# Patient Record
Sex: Male | Born: 1960 | Race: White | Hispanic: No | Marital: Single | Smoking: Current every day smoker
Health system: Southern US, Community
[De-identification: ages and names within clinical notes are randomized; demographics above are authoritative.]

## PROBLEM LIST (undated history)

## (undated) DIAGNOSIS — I059 Rheumatic mitral valve disease, unspecified: Secondary | ICD-10-CM

---

## 2005-03-29 ENCOUNTER — Emergency Department: Payer: Self-pay | Admitting: Emergency Medicine

## 2005-03-29 ENCOUNTER — Other Ambulatory Visit: Payer: Self-pay

## 2006-07-21 ENCOUNTER — Emergency Department: Payer: Self-pay | Admitting: Emergency Medicine

## 2007-11-08 ENCOUNTER — Emergency Department: Payer: Self-pay | Admitting: Emergency Medicine

## 2009-06-12 ENCOUNTER — Emergency Department: Payer: Self-pay | Admitting: Emergency Medicine

## 2009-06-25 ENCOUNTER — Emergency Department: Payer: Self-pay | Admitting: Emergency Medicine

## 2011-07-14 ENCOUNTER — Emergency Department: Payer: Self-pay | Admitting: Emergency Medicine

## 2011-07-14 LAB — CBC
HCT: 50.5 % (ref 40.0–52.0)
HGB: 17.3 g/dL (ref 13.0–18.0)
MCH: 34.1 pg — ABNORMAL HIGH (ref 26.0–34.0)
MCV: 100 fL (ref 80–100)
Platelet: 187 10*3/uL (ref 150–440)
RDW: 13.5 % (ref 11.5–14.5)
WBC: 9.2 10*3/uL (ref 3.8–10.6)

## 2011-07-14 LAB — BASIC METABOLIC PANEL
Anion Gap: 13 (ref 7–16)
Calcium, Total: 8.8 mg/dL (ref 8.5–10.1)
Co2: 25 mmol/L (ref 21–32)
EGFR (African American): >60
EGFR (Non-African Amer.): >60
Glucose: 97 mg/dL (ref 65–99)
Osmolality: 284 (ref 275–301)
Potassium: 4 mmol/L (ref 3.5–5.1)
Sodium: 144 mmol/L (ref 136–145)

## 2011-07-14 LAB — URINALYSIS, COMPLETE
Glucose,UR: 50 mg/dL (ref 0–75)
Hyaline Cast: 4
Leukocyte Esterase: NEGATIVE
Nitrite: NEGATIVE
Protein: 30
Specific Gravity: 1.024 (ref 1.003–1.030)
WBC UR: 6 /HPF (ref 0–5)

## 2011-07-14 LAB — ETHANOL
Ethanol %: 0.052 % (ref 0.000–0.080)
Ethanol: 52 mg/dL

## 2011-07-14 LAB — TROPONIN I: Troponin-I: <0.02 ng/mL

## 2011-11-14 ENCOUNTER — Emergency Department: Payer: Self-pay | Admitting: Emergency Medicine

## 2011-11-14 LAB — COMPREHENSIVE METABOLIC PANEL
Albumin: 3.8 g/dL (ref 3.4–5.0)
Anion Gap: 10 (ref 7–16)
BUN: 9 mg/dL (ref 7–18)
Bilirubin,Total: 0.3 mg/dL (ref 0.2–1.0)
Chloride: 111 mmol/L — ABNORMAL HIGH (ref 98–107)
Creatinine: 0.71 mg/dL (ref 0.60–1.30)
Potassium: 3.8 mmol/L (ref 3.5–5.1)
SGPT (ALT): 24 U/L

## 2011-11-14 LAB — URINALYSIS, COMPLETE
Blood: NEGATIVE
Glucose,UR: NEGATIVE mg/dL (ref 0–75)
Ketone: NEGATIVE
Protein: NEGATIVE
RBC,UR: <1 /HPF (ref 0–5)
Squamous Epithelial: NONE SEEN
WBC UR: <1 /HPF (ref 0–5)

## 2011-11-14 LAB — ACETAMINOPHEN LEVEL: Acetaminophen: <2 ug/mL

## 2011-11-14 LAB — DRUG SCREEN, URINE
Amphetamines, Ur Screen: NEGATIVE (ref ?–1000)
Benzodiazepine, Ur Scrn: NEGATIVE (ref ?–200)
Cannabinoid 50 Ng, Ur ~~LOC~~: NEGATIVE (ref ?–50)
MDMA (Ecstasy)Ur Screen: NEGATIVE (ref ?–500)
Opiate, Ur Screen: NEGATIVE (ref ?–300)
Tricyclic, Ur Screen: NEGATIVE (ref ?–1000)

## 2011-11-14 LAB — CBC
HGB: 16.2 g/dL (ref 13.0–18.0)
MCHC: 34.3 g/dL (ref 32.0–36.0)
MCV: 96 fL (ref 80–100)
WBC: 6.7 10*3/uL (ref 3.8–10.6)

## 2011-11-14 LAB — SALICYLATE LEVEL: Salicylates, Serum: 5.4 mg/dL — ABNORMAL HIGH

## 2011-11-14 LAB — ETHANOL: Ethanol %: 0.249 % — ABNORMAL HIGH (ref 0.000–0.080)

## 2011-11-15 LAB — ETHANOL
Ethanol %: 0.048 % (ref 0.000–0.080)
Ethanol: 48 mg/dL

## 2013-08-25 IMAGING — CT CT CHEST-ABD-PELV W/ CM
1 of 2 series · 14 of 29 positions shown, 19 images · non-contrast
Comparison: None

REASON FOR EXAM: (1) TRAUMA, ALTERED MENTAL STATUS; (2) TRAUMA, ALTERED
MENTAL STATUS, FLANK ECCH
COMMENTS:

PROCEDURE:     CT  - CT CHEST ABDOMEN AND PELVIS W  - July 14, 2011 [DATE]
RESULT:     CT CHEST, ABDOMEN, AND PELVIS
HISTORY: Trauma
TECHNIQUE: Multiple axial images obtained from the thoracic inlet to the
pubic symphysis, without p.o. contrast and with 100 ml of Lsovue-O4I
intravenous contrast.

[Series 2: soft tissue · axial · 0.70mm/px · z∈[-705,-100]mm · 14 of 137 slices shown, 19 images]
[im 8/137  mediastinal]
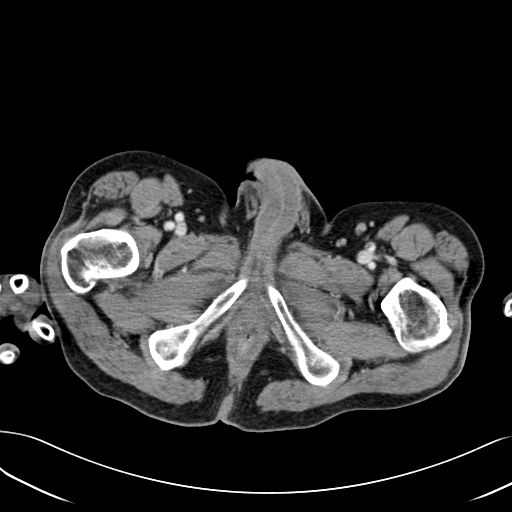
[im 8/137  bone]
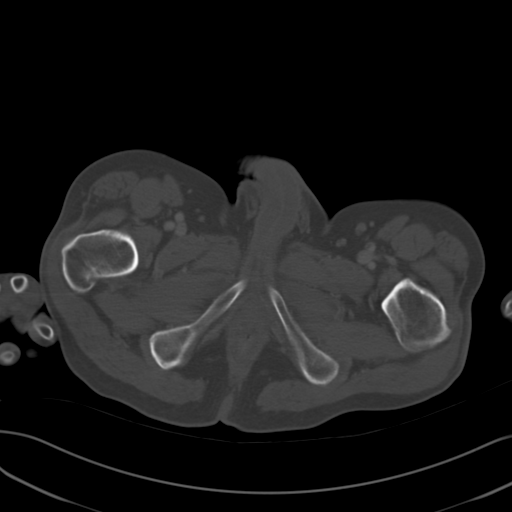
[im 23/137  mediastinal]
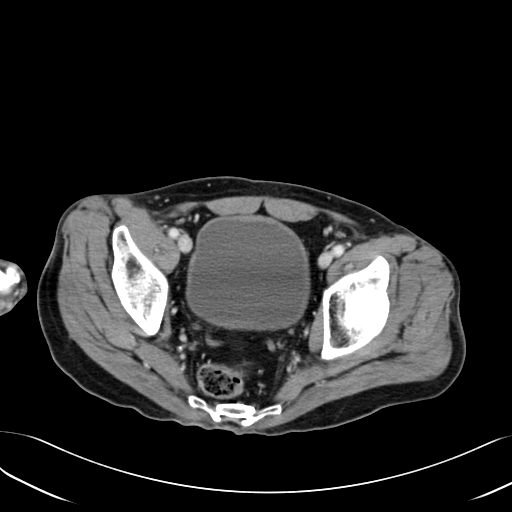
[im 31/137  mediastinal]
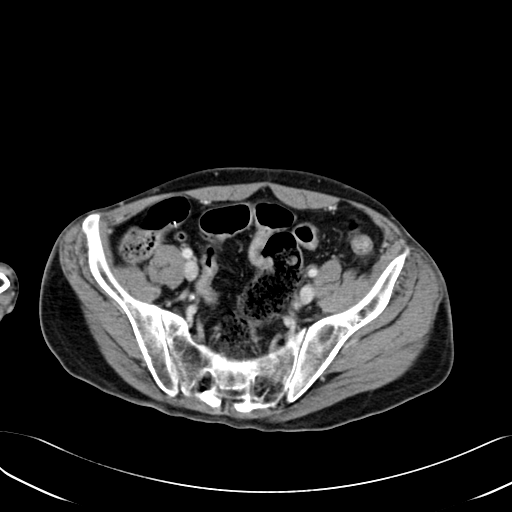
[im 38/137  mediastinal]
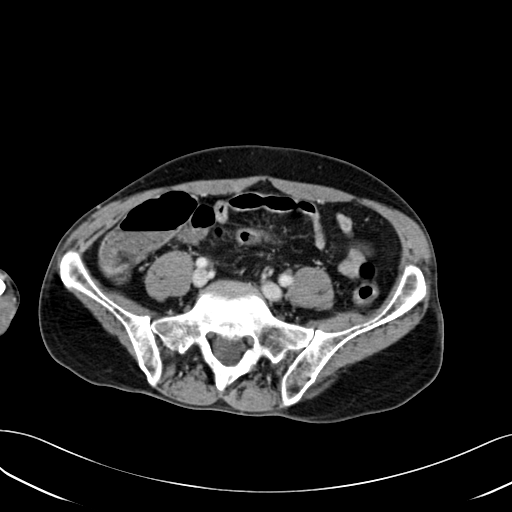
[im 53/137  mediastinal]
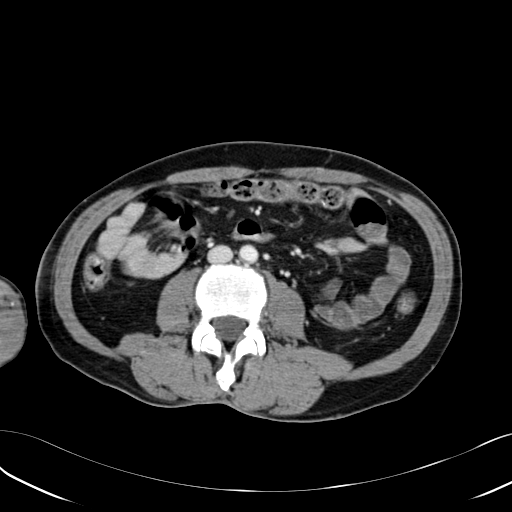
[im 61/137  mediastinal]
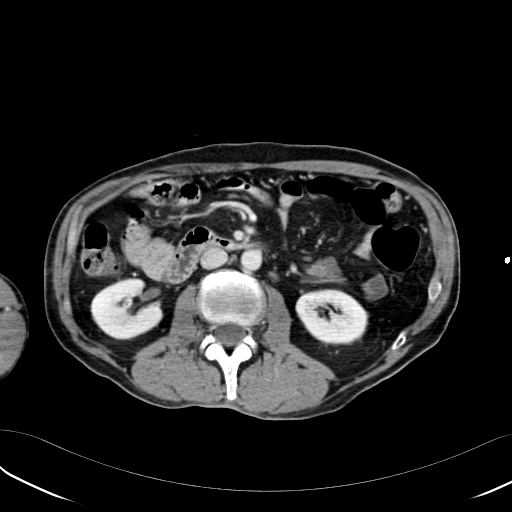
[im 69/137  mediastinal]
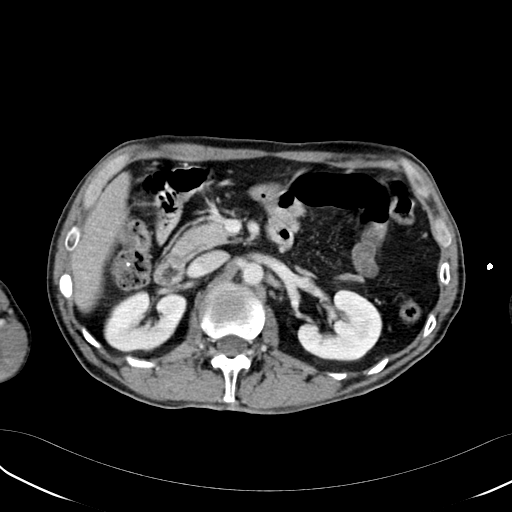
[im 76/137  mediastinal]
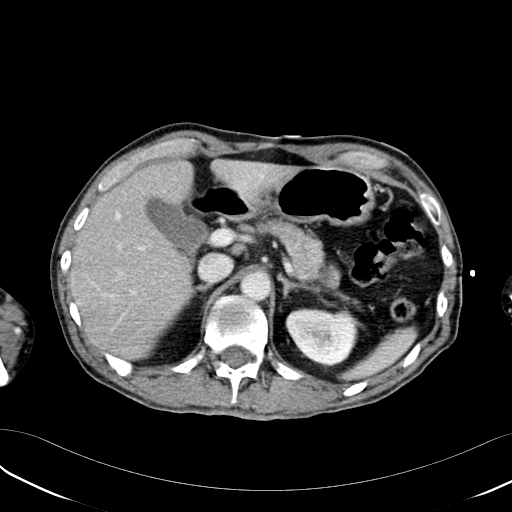
[im 84/137  mediastinal]
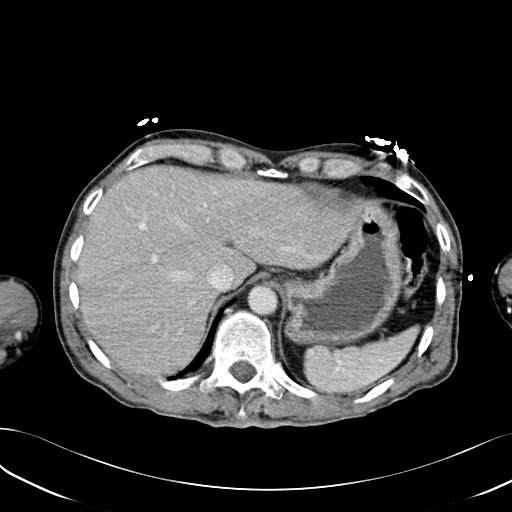
[im 84/137  bone]
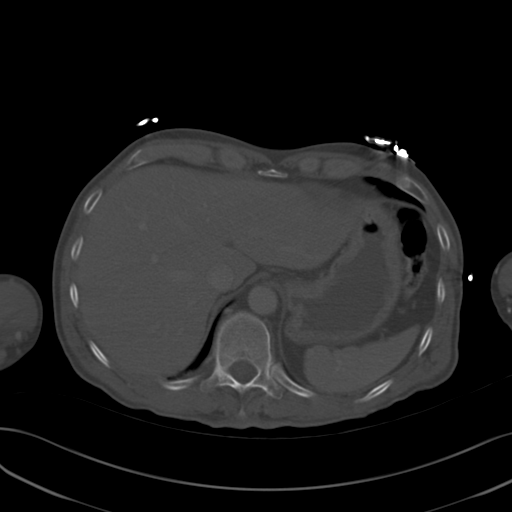
[im 99/137  mediastinal]
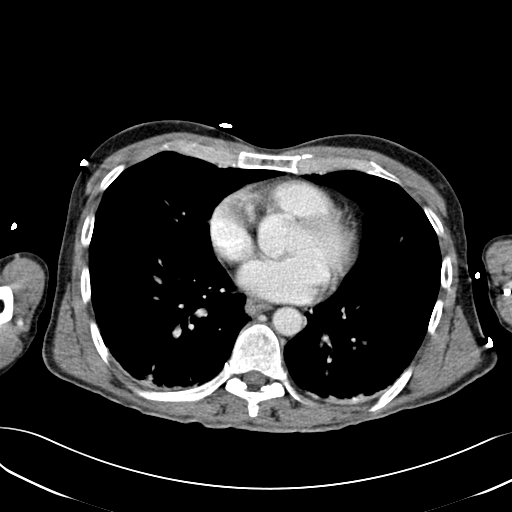
[im 106/137  mediastinal]
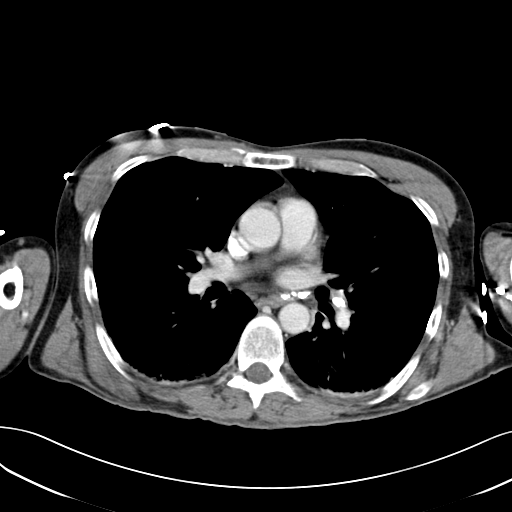
[im 106/137  lung]
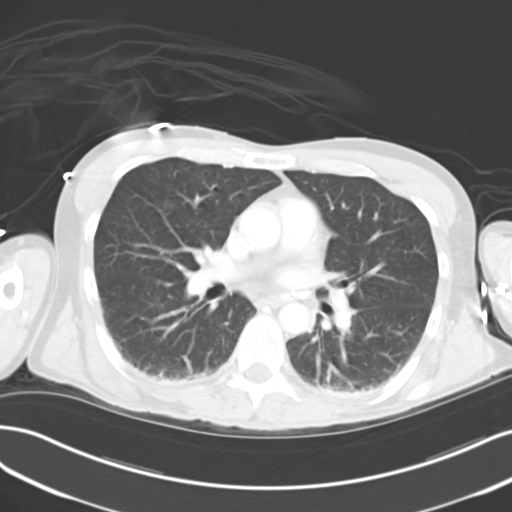
[im 114/137  mediastinal]
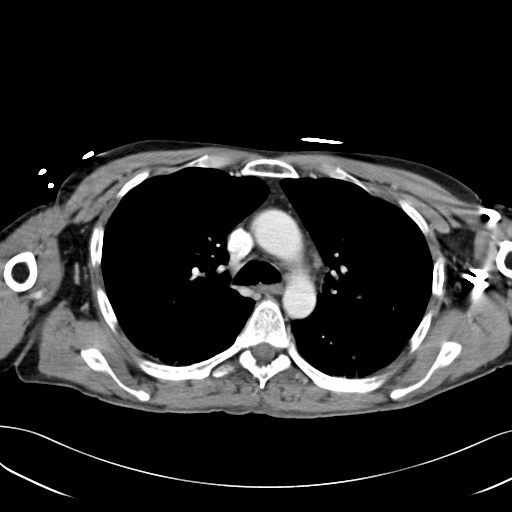
[im 114/137  lung]
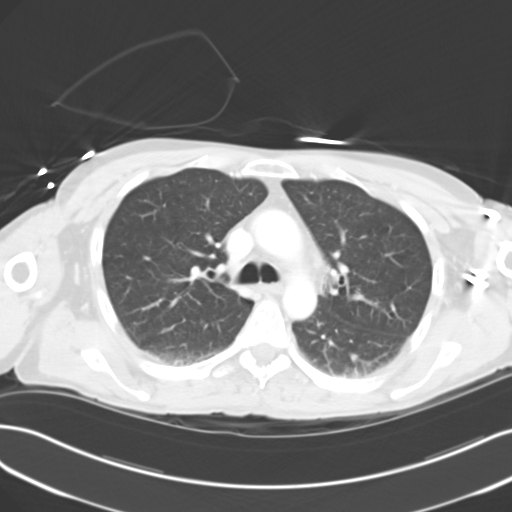
[im 121/137  lung]
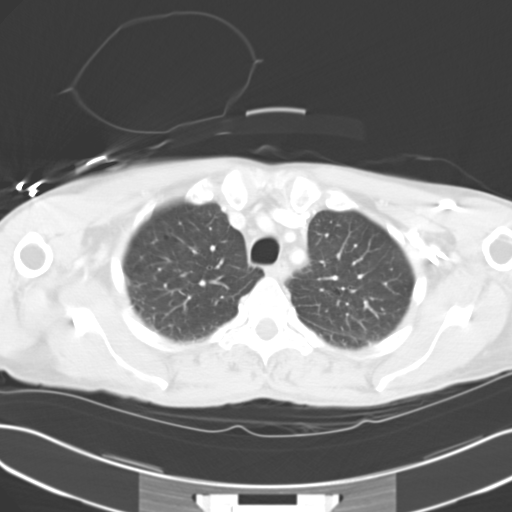
[im 129/137  mediastinal]
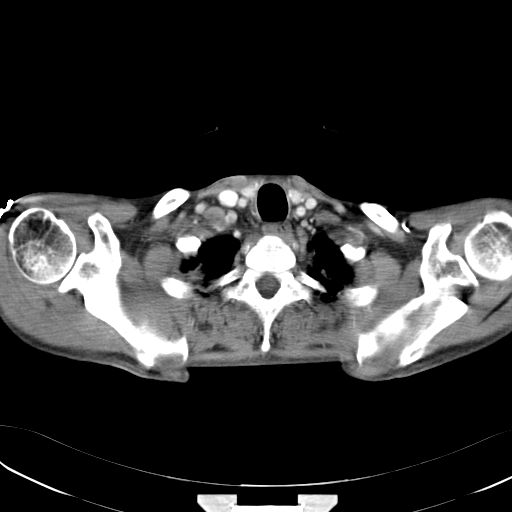
[im 129/137  lung]
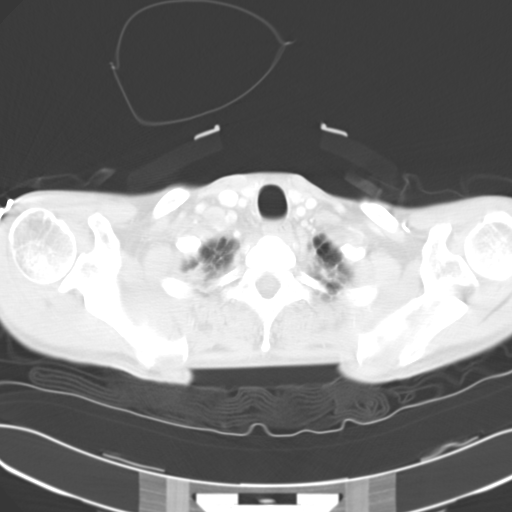

[14 of 29 positions shown; findings below may reference images not displayed]

FINDINGS: CHEST:

The lungs are clear. There is no focal mass. There is no focal parenchymal
opacity, pleural effusion, or pneumothorax.

The heart size is normal. There is no pericardial effusion.

There are no pathologically enlarged mediastinal, hilar, or axillary lymph
nodes.

The osseous structures demonstrate no focal abnormality.

ABDOMEN/PELVIS:

The liver demonstrates no focal abnormality. There is no intrahepatic or
extrahepatic biliary ductal dilatation. The gallbladder is unremarkable. The
spleen demonstrates no focal abnormality. The kidneys, adrenal glands,
pancreas are normal. The bladder is unremarkable.

The opacified stomach, duodenum, small intestine, and large intestine
demonstrate no gross abnormality, but evaluation is limited secondary to
lack of enteric contrast.. There is no pneumoperitoneum, pneumatosis, or
portal venous gas. There is no abdominal or pelvic free fluid. There is no
lymphadenopathy.

The abdominal aorta is normal in caliber with atherosclerosis.

The osseous structures are unremarkable. There is no hip fracture or
dislocation. There is no vertebral body compression fracture.
IMPRESSION: No acute injury of the chest, abdomen or pelvis.

## 2022-01-11 ENCOUNTER — Other Ambulatory Visit: Payer: Self-pay

## 2022-01-11 ENCOUNTER — Encounter: Payer: Self-pay | Admitting: Emergency Medicine

## 2022-01-11 DIAGNOSIS — Z5321 Procedure and treatment not carried out due to patient leaving prior to being seen by health care provider: Secondary | ICD-10-CM | POA: Insufficient documentation

## 2022-01-11 DIAGNOSIS — K029 Dental caries, unspecified: Secondary | ICD-10-CM | POA: Insufficient documentation

## 2022-01-11 NOTE — ED Triage Notes (Signed)
Pt presents to ER c/o upper and lower dental pain x2 weeks.  Pt states he has not been able to get into dentist yet and wants to get on some abx.  Pt has dental carries noted throughout upper and lower teeth.  Pt is A&O x4 at this time in NAD in triage.

## 2022-01-12 ENCOUNTER — Emergency Department
Admission: EM | Admit: 2022-01-12 | Discharge: 2022-01-12 | Discharge status: Against Medical Advice | Payer: Self-pay | Attending: Emergency Medicine | Admitting: Emergency Medicine

## 2022-01-12 HISTORY — DX: Rheumatic mitral valve disease, unspecified: I05.9

## 2022-01-12 NOTE — ED Notes (Signed)
Pt left per registration
